# Patient Record
Sex: Female | Born: 1975
Health system: Southern US, Community
[De-identification: ages and names within clinical notes are randomized; demographics above are authoritative.]

## PROBLEM LIST (undated history)

## (undated) DIAGNOSIS — F988 Other specified behavioral and emotional disorders with onset usually occurring in childhood and adolescence: Secondary | ICD-10-CM

## (undated) DIAGNOSIS — I10 Essential (primary) hypertension: Secondary | ICD-10-CM

## (undated) DIAGNOSIS — Z6832 Body mass index (BMI) 32.0-32.9, adult: Secondary | ICD-10-CM

## (undated) DIAGNOSIS — Z973 Presence of spectacles and contact lenses: Secondary | ICD-10-CM

## (undated) DIAGNOSIS — N939 Abnormal uterine and vaginal bleeding, unspecified: Secondary | ICD-10-CM

## (undated) HISTORY — PX: TONSILLECTOMY AND ADENOIDECTOMY: SHX28

---

## 2002-10-12 HISTORY — PX: TUBAL LIGATION: SHX77

## 2007-10-13 HISTORY — PX: ENDOMETRIAL ABLATION: SHX621

## 2015-07-25 ENCOUNTER — Other Ambulatory Visit: Payer: Self-pay

## 2015-07-25 DIAGNOSIS — Z1231 Encounter for screening mammogram for malignant neoplasm of breast: Secondary | ICD-10-CM

## 2015-07-31 ENCOUNTER — Other Ambulatory Visit (HOSPITAL_COMMUNITY)
Admission: RE | Admit: 2015-07-31 | Discharge: 2015-07-31 | Disposition: A | Discharge status: Home / Self Care | Payer: 59 | Source: Ambulatory Visit | Attending: Family Medicine | Admitting: Family Medicine

## 2015-07-31 ENCOUNTER — Other Ambulatory Visit: Payer: Self-pay | Admitting: Family Medicine

## 2015-07-31 DIAGNOSIS — Z124 Encounter for screening for malignant neoplasm of cervix: Secondary | ICD-10-CM | POA: Insufficient documentation

## 2015-08-02 LAB — CYTOLOGY - PAP

## 2015-08-14 ENCOUNTER — Ambulatory Visit
Admission: RE | Admit: 2015-08-14 | Discharge: 2015-08-14 | Disposition: A | Discharge status: Home / Self Care | Payer: 59 | Source: Ambulatory Visit

## 2015-08-14 DIAGNOSIS — Z1231 Encounter for screening mammogram for malignant neoplasm of breast: Secondary | ICD-10-CM

## 2016-02-26 ENCOUNTER — Other Ambulatory Visit: Payer: Self-pay | Admitting: Family Medicine

## 2016-02-26 DIAGNOSIS — N63 Unspecified lump in unspecified breast: Secondary | ICD-10-CM

## 2016-03-02 ENCOUNTER — Ambulatory Visit
Admission: RE | Admit: 2016-03-02 | Discharge: 2016-03-02 | Disposition: A | Discharge status: Home / Self Care | Payer: 59 | Source: Ambulatory Visit | Attending: Family Medicine | Admitting: Family Medicine

## 2016-03-02 DIAGNOSIS — N63 Unspecified lump in unspecified breast: Secondary | ICD-10-CM

## 2017-02-19 DIAGNOSIS — I10 Essential (primary) hypertension: Secondary | ICD-10-CM | POA: Diagnosis not present

## 2017-04-21 DIAGNOSIS — I10 Essential (primary) hypertension: Secondary | ICD-10-CM | POA: Diagnosis not present

## 2017-08-06 DIAGNOSIS — I10 Essential (primary) hypertension: Secondary | ICD-10-CM | POA: Diagnosis not present

## 2017-08-06 DIAGNOSIS — Z0000 Encounter for general adult medical examination without abnormal findings: Secondary | ICD-10-CM | POA: Diagnosis not present

## 2017-08-06 DIAGNOSIS — Z23 Encounter for immunization: Secondary | ICD-10-CM | POA: Diagnosis not present

## 2018-02-07 DIAGNOSIS — E785 Hyperlipidemia, unspecified: Secondary | ICD-10-CM | POA: Diagnosis not present

## 2018-02-07 DIAGNOSIS — I10 Essential (primary) hypertension: Secondary | ICD-10-CM | POA: Diagnosis not present

## 2018-08-15 DIAGNOSIS — Z0000 Encounter for general adult medical examination without abnormal findings: Secondary | ICD-10-CM | POA: Diagnosis not present

## 2018-08-15 DIAGNOSIS — E785 Hyperlipidemia, unspecified: Secondary | ICD-10-CM | POA: Diagnosis not present

## 2018-09-13 ENCOUNTER — Other Ambulatory Visit (HOSPITAL_COMMUNITY)
Admission: RE | Admit: 2018-09-13 | Discharge: 2018-09-13 | Disposition: A | Discharge status: Home / Self Care | Payer: 59 | Source: Ambulatory Visit | Attending: Internal Medicine | Admitting: Internal Medicine

## 2018-09-13 ENCOUNTER — Other Ambulatory Visit: Payer: Self-pay | Admitting: Internal Medicine

## 2018-09-13 DIAGNOSIS — Z124 Encounter for screening for malignant neoplasm of cervix: Secondary | ICD-10-CM | POA: Diagnosis not present

## 2018-09-13 DIAGNOSIS — I10 Essential (primary) hypertension: Secondary | ICD-10-CM | POA: Diagnosis not present

## 2018-09-13 DIAGNOSIS — Z01419 Encounter for gynecological examination (general) (routine) without abnormal findings: Secondary | ICD-10-CM | POA: Diagnosis not present

## 2018-09-19 LAB — CYTOLOGY - PAP
Chlamydia: NEGATIVE
Diagnosis: NEGATIVE
HPV 16/18/45 genotyping: NEGATIVE
HPV: DETECTED — AB

## 2018-10-04 DIAGNOSIS — I10 Essential (primary) hypertension: Secondary | ICD-10-CM | POA: Diagnosis not present

## 2018-11-28 DIAGNOSIS — I10 Essential (primary) hypertension: Secondary | ICD-10-CM | POA: Diagnosis not present

## 2019-08-28 ENCOUNTER — Other Ambulatory Visit: Payer: Self-pay | Admitting: Internal Medicine

## 2019-08-28 DIAGNOSIS — Z1231 Encounter for screening mammogram for malignant neoplasm of breast: Secondary | ICD-10-CM

## 2019-10-20 ENCOUNTER — Other Ambulatory Visit: Payer: Self-pay

## 2019-10-20 ENCOUNTER — Ambulatory Visit
Admission: RE | Admit: 2019-10-20 | Discharge: 2019-10-20 | Disposition: A | Discharge status: Home / Self Care | Payer: 59 | Source: Ambulatory Visit | Attending: Internal Medicine | Admitting: Internal Medicine

## 2019-10-20 DIAGNOSIS — Z1231 Encounter for screening mammogram for malignant neoplasm of breast: Secondary | ICD-10-CM

## 2020-10-18 ENCOUNTER — Other Ambulatory Visit: Payer: Self-pay | Admitting: Internal Medicine

## 2020-10-18 DIAGNOSIS — Z1231 Encounter for screening mammogram for malignant neoplasm of breast: Secondary | ICD-10-CM

## 2020-10-21 ENCOUNTER — Ambulatory Visit: Payer: 59

## 2020-11-29 ENCOUNTER — Ambulatory Visit
Admission: RE | Admit: 2020-11-29 | Discharge: 2020-11-29 | Disposition: A | Discharge status: Home / Self Care | Payer: 59 | Source: Ambulatory Visit | Attending: Internal Medicine | Admitting: Internal Medicine

## 2020-11-29 ENCOUNTER — Other Ambulatory Visit: Payer: Self-pay

## 2020-11-29 DIAGNOSIS — Z1231 Encounter for screening mammogram for malignant neoplasm of breast: Secondary | ICD-10-CM

## 2021-02-27 NOTE — H&P (Addendum)
Julie Chapman is an 45 y.o. [id number] s/p BTL (2004) and endometrial ablation (2008 or 2009) who is admitted for Total Vaginal Hysterectomy with Bilateral Salpingectomy for abnormal uterine bleeding.  Patient has a long-standing history of heavy bleeding since menarche at age 87. She reports left-sided abdominal pain with menses as well. She states that the endometrial ablation did not work to aid with her bleeding. She declines management with Hysteroscopy with Dilation and Currettage and IUD placement. She desires definitive management via hysterectomy for her heavy bleeding as it impacts her daily life.  Work-up: NILM (+) HRHPV (negative 16/18/45) 2019 (to be performed again prior to surgery) EMB (12/25/20): benign endometrial polyp, secretory endometrium, day 16-17, post-ovulatory day 2-3. No atypia or malignancy. CBC (09/2020): 6.4>14/42<270 TSH (2019): 1.78 (wnl)  TVUS (21-Jul-1976): Uterus 9.71 x 5.66 x 7.28cm, endometrial thickness 0.70cm, fibroid 1 1.43cm, fibroid 2 2.30cm. R ovary 3.63cm, L ovary 2.74cm. Retroverted uterus - inhomogenous in appearance. Uterine fibroids - posterior 1.4 x 1.3 x 1.0cm, left pedunculated 2.6 x 2.3 x 1.9cm. Endometrium thickened and trilayered with questionable hyperchoic mass fundal posterior wall 0.9 x 0.7 x 0.4cm, no blood flow noted; s/p ablation. R ovary - simple cyst 2.6 x 2.2 x 1.8cm, avascular. Left ovary wnl. No adnexal masses seen.  Patient Active Problem List   Diagnosis Date Noted  . Dysmenorrhea 03/04/2021  . Attention deficit disorder 03/04/2021  . Excessive and frequent menstruation 03/04/2021  . Hyperlipidemia 03/04/2021  . Hypertension 03/04/2021  . Irregular periods 03/04/2021   MEDICAL/FAMILY/SOCIAL HX: No LMP recorded.    Past Medical History:  Diagnosis Date  . Abnormal uterine bleeding (AUB)   . ADD (attention deficit disorder)   . BMI 32.0-32.9,adult   . Hypertension     Past Surgical History:  Procedure Laterality Date  .  ENDOMETRIAL ABLATION    . TONSILLECTOMY AND ADENOIDECTOMY    . TUBAL LIGATION      No family history on file.  Social History:  has no history on file for tobacco use, alcohol use, and drug use.  ALLERGIES/MEDS:  Allergies: Not on File  No medications prior to admission.     Review of Systems  Constitutional: Negative.   HENT: Negative.   Eyes: Negative.   Respiratory: Negative.   Cardiovascular: Negative.   Gastrointestinal: Negative.   Genitourinary: Negative.   Musculoskeletal: Negative.   Skin: Negative.   Neurological: Negative.   Endo/Heme/Allergies: Negative.   Psychiatric/Behavioral: Negative.     There were no vitals taken for this visit. Gen: NAD, pleasant, and cooperative Cardio: RRR Lungs:  CTAB, no wheezes/rales/rhonchi Abd:  Soft, non-distended, non-tender Ext:  No bilateral LE edema Pelvic: Labia - unremarkable, vagina - pink moist mucosa, no lesions or abnormal discharge, cervix - no discharge or lesions or CMT, adnexa - no masses or tenderness, uterus - non-tender and normal size on palpation, good uterine descensus  No results found for this or any previous visit (from the past 24 hour(s)).  No results found.   ASSESSMENT/PLAN: Julie Chapman is a 45 y.o. [id number] s/p BTL (2004) and endometrial ablation (2008 or 2009) who is admitted for Total Vaginal Hysterectomy with Bilateral Salpingectomy for abnormal uterine bleeding.  - Admit to [facility] Main OR - Admit labs (CBC, T&S, BMP COVID screen) - Diet:  NPO - IVF:  Per anesthesia - VTE Prophylaxis:  SCDs - Antibiotics: Ancef 2g on call to OR - D/C home POD#1  Consents: I have explained to the patient that this surgery is performed  to remove the uterus through an incision in the vagina and that it will result in sterility.  I discussed the risks and benefits of the surgery, including, but not limited to bleeding, including the need for a blood transfusion, infection, damage to surrounding organs and  tissues, damage to bladder, damage to ureters, causing kidney damage, and requiring additional procedures, damage to bowels, resulting in further surgery, postoperative pain, short-term and long-term, scarring intra-abdominally, need for an open procedure, need for further surgery, deep vein thrombosis and/or pulmonary embolism, wound infection and/or separation, painful intercourse, urinary leakage, ovarian failure, resulting in menopausal symptoms requiring treatment, fistula formation, complications the course of which cannot be predicted or prevented, and death. Patient was consented for blood products.  The patient is aware that bleeding may result in the need for a blood transfusion which includes risk of transmission of HIV (1:2 million), Hepatitis C (1:2 million), and Hepatitis B (1:200 thousand) and transfusion reaction.  Patient voiced understanding of the above risks as well as understanding of indications for blood transfusion.   Steva Ready, DO [phone] (office)

## 2021-03-04 ENCOUNTER — Encounter (HOSPITAL_BASED_OUTPATIENT_CLINIC_OR_DEPARTMENT_OTHER): Payer: Self-pay | Admitting: Obstetrics and Gynecology

## 2021-03-04 DIAGNOSIS — N920 Excessive and frequent menstruation with regular cycle: Secondary | ICD-10-CM | POA: Insufficient documentation

## 2021-03-04 DIAGNOSIS — N946 Dysmenorrhea, unspecified: Secondary | ICD-10-CM | POA: Insufficient documentation

## 2021-03-04 DIAGNOSIS — F988 Other specified behavioral and emotional disorders with onset usually occurring in childhood and adolescence: Secondary | ICD-10-CM | POA: Insufficient documentation

## 2021-03-04 DIAGNOSIS — N926 Irregular menstruation, unspecified: Secondary | ICD-10-CM | POA: Insufficient documentation

## 2021-03-04 DIAGNOSIS — E785 Hyperlipidemia, unspecified: Secondary | ICD-10-CM | POA: Insufficient documentation

## 2021-03-04 DIAGNOSIS — I10 Essential (primary) hypertension: Secondary | ICD-10-CM | POA: Insufficient documentation

## 2021-03-05 ENCOUNTER — Other Ambulatory Visit: Payer: Self-pay

## 2021-03-05 ENCOUNTER — Encounter (HOSPITAL_BASED_OUTPATIENT_CLINIC_OR_DEPARTMENT_OTHER): Payer: Self-pay | Admitting: Obstetrics and Gynecology

## 2021-03-05 NOTE — Progress Notes (Signed)
YOU ARE SCHEDULED FOR A COVID TEST 03-11-2021 AT 1015. THIS TEST MUST BE DONE BEFORE SURGERY. GO TO  [address] [place], [place], IT IS APPROXIMATELY 2 MINUTES PAST [employer] ON THE RIGHT AND REMAIN IN YOUR CAR, THIS IS A DRIVE UP TEST.     Your procedure is scheduled on 03-13-2021  Report to [facility] [place] AT  1145 A. M.   Call this number if you have problems the morning of surgery  :[phone].   OUR ADDRESS IS [address].  WE ARE LOCATED IN [facility].  PLEASE BRING YOUR INSURANCE CARD AND PHOTO ID DAY OF SURGERY.  ONLY ONE PERSON ALLOWED IN FACILITY WAITING AREA.                                     REMEMBER:  DO NOT EAT FOOD, CANDY GUM OR MINTS  AFTER MIDNIGHT . YOU MAY HAVE CLEAR LIQUIDS FROM MIDNIGHT UNTIL 1045. NO CLEAR LIQUIDS AFTER  1045 DAY OF SURGERY.   YOU MAY  BRUSH YOUR TEETH MORNING OF SURGERY AND RINSE YOUR MOUTH OUT, NO CHEWING GUM CANDY OR MINTS.    CLEAR LIQUID DIET   Foods Allowed                                                                     Foods Excluded  Coffee and tea, regular and decaf                             liquids that you cannot  Plain Jell-O any favor except red or purple                                           see through such as: Fruit ices (not with fruit pulp)                                     milk, soups, orange juice  Iced Popsicles                                    All solid food Carbonated beverages, regular and diet                                    Cranberry, grape and apple juices Sports drinks like Gatorade Lightly seasoned clear broth or consume(fat free) Sugar, honey syrup  Sample Menu Breakfast                                Lunch  Supper Cranberry juice                    Beef broth                            Chicken broth Jell-O                                     Grape juice                           Apple juice Coffee  or tea                        Jell-O                                      Popsicle                                                Coffee or tea                        Coffee or tea  _____________________________________________________________________     TAKE THESE MEDICATIONS MORNING OF SURGERY WITH A SIP OF WATER:  NONE (TAKE YOUR LISINOPRIL DAY BEFORE SURGERY BUT NOT DAY OF SURGERY.  ONE VISITOR IS ALLOWED IN WAITING ROOM ONLY DAY OF SURGERY.  NO VISITOR MAY SPEND THE NIGHT.  VISITOR ARE ALLOWED TO STAY UNTIL 800 PM.                                    DO NOT WEAR JEWERLY, MAKE UP. DO NOT WEAR LOTIONS, POWDERS, PERFUMES OR DEODORANT. DO NOT SHAVE FOR 24 HOURS PRIOR TO DAY OF SURGERY. MEN MAY SHAVE FACE AND NECK. CONTACTS, GLASSES, OR DENTURES MAY NOT BE WORN TO SURGERY.                                    [facility] IS NOT RESPONSIBLE  FOR ANY BELONGINGS.                                                                    Marland Kitchen           [facility] - Preparing for Surgery Before surgery, you can play an important role.  Because skin is not sterile, your skin needs to be as free of germs as possible.  You can reduce the number of germs on your skin by washing with CHG (chlorahexidine gluconate) soap before surgery.  CHG is an antiseptic cleaner which kills germs and bonds with the skin to continue killing germs even after washing. Please DO NOT use if you have an allergy to  CHG or antibacterial soaps.  If your skin becomes reddened/irritated stop using the CHG and inform your nurse when you arrive at Short Stay. Do not shave (including legs and underarms) for at least 48 hours prior to the first CHG shower.  You may shave your face/neck. Please follow these instructions carefully:  1.  Shower with CHG Soap the night before surgery and the  morning of Surgery.  2.  If you choose to wash your hair, wash your hair first as usual with your  normal  shampoo.  3.  After you shampoo, rinse your hair  and body thoroughly to remove the  shampoo.                            4.  Use CHG as you would any other liquid soap.  You can apply chg directly  to the skin and wash                      Gently with a scrungie or clean washcloth.  5.  Apply the CHG Soap to your body ONLY FROM THE NECK DOWN.   Do not use on face/ open                           Wound or open sores. Avoid contact with eyes, ears mouth and genitals (private parts).                       Wash face,  Genitals (private parts) with your normal soap.             6.  Wash thoroughly, paying special attention to the area where your surgery  will be performed.  7.  Thoroughly rinse your body with warm water from the neck down.  8.  DO NOT shower/wash with your normal soap after using and rinsing off  the CHG Soap.                9.  Pat yourself dry with a clean towel.            10.  Wear clean pajamas.            11.  Place clean sheets on your bed the night of your first shower and do not  sleep with pets. Day of Surgery : Do not apply any lotions/deodorants the morning of surgery.  Please wear clean clothes to the hospital/surgery center.  FAILURE TO FOLLOW THESE INSTRUCTIONS MAY RESULT IN THE CANCELLATION OF YOUR SURGERY PATIENT SIGNATURE_________________________________  NURSE SIGNATURE__________________________________  ________________________________________________________________________                                                        QUESTIONS Annye Asa PRE OP NURSE PHONE [phone]

## 2021-03-05 NOTE — Progress Notes (Addendum)
Spoke w/ via phone for pre-op interview---pt Lab needs dos--- URINE POCT-  Has lab appt 03-11-2021 830 am for cbc bmp t & s, ekg             Lab results-----none- COVID test -----03-11-2021 1015 Arrive at -------1145 am 03-13-2021 NPO after MN NO Solid Food.  Clear liquids from MN until---1045 am then npo Med rec completed Medications to take morning of surgery -----none Diabetic medication -----n/a Patient instructed to bring photo id and insurance card day of surgery Patient aware to have Driver (ride ) / caregiver spouse daniel will stay    for 24 hours after surgery  Patient Special Instructions -----pt given overnight stay instructions Pre-Op special Istructions -----none Patient verbalized understanding of instructions that were given at this phone interview. Patient denies shortness of breath, chest pain, fever, cough at this phone interview.

## 2021-03-11 ENCOUNTER — Other Ambulatory Visit: Payer: Self-pay

## 2021-03-11 ENCOUNTER — Encounter (HOSPITAL_COMMUNITY)
Admission: RE | Admit: 2021-03-11 | Discharge: 2021-03-11 | Disposition: A | Discharge status: Home / Self Care | Payer: 59 | Source: Ambulatory Visit | Attending: Obstetrics and Gynecology | Admitting: Obstetrics and Gynecology

## 2021-03-11 ENCOUNTER — Other Ambulatory Visit (HOSPITAL_COMMUNITY)
Admission: RE | Admit: 2021-03-11 | Discharge: 2021-03-11 | Disposition: A | Discharge status: Home / Self Care | Payer: 59 | Source: Ambulatory Visit | Attending: Obstetrics and Gynecology | Admitting: Obstetrics and Gynecology

## 2021-03-11 DIAGNOSIS — Z20822 Contact with and (suspected) exposure to covid-19: Secondary | ICD-10-CM | POA: Insufficient documentation

## 2021-03-11 DIAGNOSIS — Z01818 Encounter for other preprocedural examination: Secondary | ICD-10-CM | POA: Insufficient documentation

## 2021-03-11 LAB — BASIC METABOLIC PANEL
Anion gap: 8 (ref 5–15)
BUN: 7 mg/dL (ref 6–20)
CO2: 27 mmol/L (ref 22–32)
Calcium: 9.3 mg/dL (ref 8.9–10.3)
Chloride: 104 mmol/L (ref 98–111)
Creatinine, Ser: 0.82 mg/dL (ref 0.44–1.00)
GFR, Estimated: >60 mL/min (ref >60)
Glucose, Bld: 96 mg/dL (ref 70–99)
Potassium: 3.9 mmol/L (ref 3.5–5.1)
Sodium: 139 mmol/L (ref 135–145)

## 2021-03-11 LAB — CBC
HCT: 43.1 % (ref 36.0–46.0)
Hemoglobin: 14 g/dL (ref 12.0–15.0)
MCH: 28.9 pg (ref 26.0–34.0)
MCHC: 32.5 g/dL (ref 30.0–36.0)
MCV: 88.9 fL (ref 80.0–100.0)
Platelets: 246 10*3/uL (ref 150–400)
RBC: 4.85 MIL/uL (ref 3.87–5.11)
RDW: 12.3 % (ref 11.5–15.5)
WBC: 6.5 10*3/uL (ref 4.0–10.5)
nRBC: 0 % (ref 0.0–0.2)

## 2021-03-11 LAB — SARS CORONAVIRUS 2 (TAT 6-24 HRS): SARS Coronavirus 2: NEGATIVE

## 2021-03-13 ENCOUNTER — Encounter (HOSPITAL_BASED_OUTPATIENT_CLINIC_OR_DEPARTMENT_OTHER): Payer: Self-pay | Admitting: Obstetrics and Gynecology

## 2021-03-13 ENCOUNTER — Ambulatory Visit (HOSPITAL_BASED_OUTPATIENT_CLINIC_OR_DEPARTMENT_OTHER): Payer: 59 | Admitting: Anesthesiology

## 2021-03-13 ENCOUNTER — Ambulatory Visit (HOSPITAL_BASED_OUTPATIENT_CLINIC_OR_DEPARTMENT_OTHER)
Admission: RE | Admit: 2021-03-13 | Discharge: 2021-03-14 | Disposition: A | Discharge status: Home / Self Care | Payer: 59 | Attending: Obstetrics and Gynecology | Admitting: Obstetrics and Gynecology

## 2021-03-13 ENCOUNTER — Encounter (HOSPITAL_BASED_OUTPATIENT_CLINIC_OR_DEPARTMENT_OTHER)
Admission: RE | Disposition: A | Discharge status: Home / Self Care | Payer: Self-pay | Source: Home / Self Care | Attending: Obstetrics and Gynecology

## 2021-03-13 DIAGNOSIS — N920 Excessive and frequent menstruation with regular cycle: Secondary | ICD-10-CM | POA: Insufficient documentation

## 2021-03-13 DIAGNOSIS — N946 Dysmenorrhea, unspecified: Secondary | ICD-10-CM | POA: Insufficient documentation

## 2021-03-13 DIAGNOSIS — N939 Abnormal uterine and vaginal bleeding, unspecified: Secondary | ICD-10-CM | POA: Diagnosis present

## 2021-03-13 DIAGNOSIS — Z791 Long term (current) use of non-steroidal anti-inflammatories (NSAID): Secondary | ICD-10-CM | POA: Insufficient documentation

## 2021-03-13 HISTORY — DX: Other specified behavioral and emotional disorders with onset usually occurring in childhood and adolescence: F98.8

## 2021-03-13 HISTORY — DX: Presence of spectacles and contact lenses: Z97.3

## 2021-03-13 HISTORY — DX: Abnormal uterine and vaginal bleeding, unspecified: N93.9

## 2021-03-13 HISTORY — DX: Body mass index (BMI) 32.0-32.9, adult: Z68.32

## 2021-03-13 HISTORY — DX: Essential (primary) hypertension: I10

## 2021-03-13 HISTORY — PX: VAGINAL HYSTERECTOMY: SHX2639

## 2021-03-13 LAB — TYPE AND SCREEN
ABO/RH(D): A POS
Antibody Screen: NEGATIVE

## 2021-03-13 LAB — POCT PREGNANCY, URINE: Preg Test, Ur: NEGATIVE

## 2021-03-13 LAB — ABO/RH: ABO/RH(D): A POS

## 2021-03-13 SURGERY — HYSTERECTOMY, VAGINAL
Anesthesia: General | Site: Vagina

## 2021-03-13 MED ORDER — ROCURONIUM BROMIDE 10 MG/ML (PF) SYRINGE
PREFILLED_SYRINGE | INTRAVENOUS | Status: DC | PRN
  Administered 2021-03-13: 70 mg via INTRAVENOUS

## 2021-03-13 MED ORDER — FENTANYL CITRATE (PF) 250 MCG/5ML IJ SOLN
INTRAMUSCULAR | Status: AC
  Filled 2021-03-13: qty 5

## 2021-03-13 MED ORDER — CEFAZOLIN SODIUM-DEXTROSE 2-4 GM/100ML-% IV SOLN
2.0000 g | INTRAVENOUS | Status: AC
  Administered 2021-03-13: 2 g via INTRAVENOUS

## 2021-03-13 MED ORDER — PROPOFOL 10 MG/ML IV BOLUS
INTRAVENOUS | Status: AC
  Filled 2021-03-13: qty 20

## 2021-03-13 MED ORDER — SIMETHICONE 80 MG PO CHEW
CHEWABLE_TABLET | ORAL | Status: AC
  Filled 2021-03-13: qty 1

## 2021-03-13 MED ORDER — LIDOCAINE 2% (20 MG/ML) 5 ML SYRINGE
INTRAMUSCULAR | Status: DC | PRN
  Administered 2021-03-13: 80 mg via INTRAVENOUS

## 2021-03-13 MED ORDER — OXYCODONE HCL 5 MG PO TABS
5.0000 mg | ORAL_TABLET | ORAL | Status: DC | PRN
  Administered 2021-03-13: 5 mg via ORAL
  Administered 2021-03-14 (×3): 10 mg via ORAL

## 2021-03-13 MED ORDER — ONDANSETRON HCL 4 MG/2ML IJ SOLN
INTRAMUSCULAR | Status: DC | PRN
  Administered 2021-03-13: 4 mg via INTRAVENOUS

## 2021-03-13 MED ORDER — ONDANSETRON HCL 4 MG/2ML IJ SOLN
INTRAMUSCULAR | Status: AC
  Filled 2021-03-13: qty 2

## 2021-03-13 MED ORDER — HYDROMORPHONE HCL 1 MG/ML IJ SOLN
INTRAMUSCULAR | Status: AC
  Filled 2021-03-13: qty 1

## 2021-03-13 MED ORDER — DEXAMETHASONE SODIUM PHOSPHATE 10 MG/ML IJ SOLN
INTRAMUSCULAR | Status: DC | PRN
  Administered 2021-03-13: 8 mg via INTRAVENOUS

## 2021-03-13 MED ORDER — MIDAZOLAM HCL 2 MG/2ML IJ SOLN
INTRAMUSCULAR | Status: AC
  Filled 2021-03-13: qty 2

## 2021-03-13 MED ORDER — IBUPROFEN 800 MG PO TABS
ORAL_TABLET | ORAL | Status: AC
  Filled 2021-03-13: qty 1

## 2021-03-13 MED ORDER — SODIUM CHLORIDE 0.9 % IV SOLN
6.2500 mg | Freq: Four times a day (QID) | INTRAVENOUS | Status: DC | PRN
  Filled 2021-03-13: qty 0.25

## 2021-03-13 MED ORDER — SCOPOLAMINE 1 MG/3DAYS TD PT72
1.0000 | MEDICATED_PATCH | TRANSDERMAL | Status: DC
  Administered 2021-03-13: 1.5 mg via TRANSDERMAL

## 2021-03-13 MED ORDER — LIDOCAINE-EPINEPHRINE 1 %-1:100000 IJ SOLN
INTRAMUSCULAR | Status: DC | PRN
  Administered 2021-03-13: 20 mL

## 2021-03-13 MED ORDER — ACETAMINOPHEN 500 MG PO TABS
1000.0000 mg | ORAL_TABLET | Freq: Once | ORAL | Status: AC
  Administered 2021-03-13: 1000 mg via ORAL

## 2021-03-13 MED ORDER — ONDANSETRON HCL 4 MG/2ML IJ SOLN
4.0000 mg | Freq: Four times a day (QID) | INTRAMUSCULAR | Status: DC | PRN
  Administered 2021-03-13: 4 mg via INTRAVENOUS

## 2021-03-13 MED ORDER — ROCURONIUM BROMIDE 10 MG/ML (PF) SYRINGE
PREFILLED_SYRINGE | INTRAVENOUS | Status: AC
  Filled 2021-03-13: qty 10

## 2021-03-13 MED ORDER — FENTANYL CITRATE (PF) 100 MCG/2ML IJ SOLN: 25.0000 ug | INTRAMUSCULAR | Status: DC | PRN

## 2021-03-13 MED ORDER — CEFAZOLIN SODIUM-DEXTROSE 2-4 GM/100ML-% IV SOLN
INTRAVENOUS | Status: AC
  Filled 2021-03-13: qty 100

## 2021-03-13 MED ORDER — LIDOCAINE 2% (20 MG/ML) 5 ML SYRINGE
INTRAMUSCULAR | Status: AC
  Filled 2021-03-13: qty 5

## 2021-03-13 MED ORDER — IBUPROFEN 800 MG PO TABS
800.0000 mg | ORAL_TABLET | Freq: Three times a day (TID) | ORAL | Status: DC
  Administered 2021-03-13 – 2021-03-14 (×2): 800 mg via ORAL

## 2021-03-13 MED ORDER — DOCUSATE SODIUM 100 MG PO CAPS
100.0000 mg | ORAL_CAPSULE | Freq: Two times a day (BID) | ORAL | Status: DC
  Administered 2021-03-13: 100 mg via ORAL

## 2021-03-13 MED ORDER — SIMETHICONE 80 MG PO CHEW
80.0000 mg | CHEWABLE_TABLET | Freq: Four times a day (QID) | ORAL | Status: DC | PRN
  Administered 2021-03-13: 80 mg via ORAL

## 2021-03-13 MED ORDER — PROMETHAZINE HCL 25 MG/ML IJ SOLN
INTRAMUSCULAR | Status: AC
  Filled 2021-03-13: qty 1

## 2021-03-13 MED ORDER — HYDROMORPHONE HCL 1 MG/ML IJ SOLN
0.2500 mg | INTRAMUSCULAR | Status: DC | PRN
  Administered 2021-03-13 (×2): 0.5 mg via INTRAVENOUS

## 2021-03-13 MED ORDER — PROMETHAZINE HCL 25 MG/ML IJ SOLN: 6.2500 mg | INTRAMUSCULAR | Status: DC | PRN

## 2021-03-13 MED ORDER — OXYCODONE HCL 5 MG PO TABS
ORAL_TABLET | ORAL | Status: AC
  Filled 2021-03-13: qty 2

## 2021-03-13 MED ORDER — ACETAMINOPHEN 500 MG PO TABS
1000.0000 mg | ORAL_TABLET | Freq: Four times a day (QID) | ORAL | Status: DC | PRN
  Administered 2021-03-13 – 2021-03-14 (×3): 1000 mg via ORAL

## 2021-03-13 MED ORDER — MORPHINE SULFATE (PF) 2 MG/ML IV SOLN
INTRAVENOUS | Status: AC
  Filled 2021-03-13: qty 1

## 2021-03-13 MED ORDER — ACETAMINOPHEN 500 MG PO TABS
ORAL_TABLET | ORAL | Status: AC
  Filled 2021-03-13: qty 2

## 2021-03-13 MED ORDER — PROMETHAZINE HCL 25 MG/ML IJ SOLN
6.2500 mg | Freq: Four times a day (QID) | INTRAMUSCULAR | Status: DC | PRN
  Administered 2021-03-13: 6.25 mg via INTRAVENOUS
  Filled 2021-03-13: qty 1

## 2021-03-13 MED ORDER — FENTANYL CITRATE (PF) 100 MCG/2ML IJ SOLN
INTRAMUSCULAR | Status: DC | PRN
  Administered 2021-03-13: 100 ug via INTRAVENOUS
  Administered 2021-03-13: 25 ug via INTRAVENOUS
  Administered 2021-03-13: 50 ug via INTRAVENOUS

## 2021-03-13 MED ORDER — PROPOFOL 10 MG/ML IV BOLUS
INTRAVENOUS | Status: DC | PRN
  Administered 2021-03-13: 160 mg via INTRAVENOUS

## 2021-03-13 MED ORDER — LACTATED RINGERS IV SOLN: INTRAVENOUS | Status: DC

## 2021-03-13 MED ORDER — DOCUSATE SODIUM 100 MG PO CAPS
ORAL_CAPSULE | ORAL | Status: AC
  Filled 2021-03-13: qty 1

## 2021-03-13 MED ORDER — SUGAMMADEX SODIUM 200 MG/2ML IV SOLN
INTRAVENOUS | Status: DC | PRN
  Administered 2021-03-13: 180 mg via INTRAVENOUS

## 2021-03-13 MED ORDER — ONDANSETRON HCL 4 MG PO TABS
4.0000 mg | ORAL_TABLET | Freq: Four times a day (QID) | ORAL | Status: DC | PRN
Start: 2021-03-13 — End: 2021-03-14

## 2021-03-13 MED ORDER — MORPHINE SULFATE (PF) 4 MG/ML IV SOLN
1.0000 mg | INTRAVENOUS | Status: DC | PRN
  Administered 2021-03-13 (×2): 1 mg via INTRAVENOUS

## 2021-03-13 MED ORDER — DEXAMETHASONE SODIUM PHOSPHATE 10 MG/ML IJ SOLN
INTRAMUSCULAR | Status: AC
  Filled 2021-03-13: qty 1

## 2021-03-13 MED ORDER — KETOROLAC TROMETHAMINE 30 MG/ML IJ SOLN
INTRAMUSCULAR | Status: DC | PRN
  Administered 2021-03-13: 30 mg via INTRAVENOUS

## 2021-03-13 MED ORDER — SCOPOLAMINE 1 MG/3DAYS TD PT72
MEDICATED_PATCH | TRANSDERMAL | Status: AC
  Filled 2021-03-13: qty 1

## 2021-03-13 MED ORDER — MIDAZOLAM HCL 5 MG/5ML IJ SOLN
INTRAMUSCULAR | Status: DC | PRN
  Administered 2021-03-13: 2 mg via INTRAVENOUS

## 2021-03-13 SURGICAL SUPPLY — 26 items
COVER MAYO STAND STRL (DRAPES) ×4 IMPLANT
COVER WAND RF STERILE (DRAPES) ×2 IMPLANT
DRAPE STERI URO 9X17 APER PCH (DRAPES) ×2 IMPLANT
GLOVE BIO SURGEON STRL SZ 6.5 (GLOVE) ×2 IMPLANT
GLOVE SURG ENC MOIS LTX SZ6.5 (GLOVE) ×2 IMPLANT
GLOVE SURG ENC MOIS LTX SZ7 (GLOVE) ×6 IMPLANT
GLOVE SURG POLYISO LF SZ7 (GLOVE) ×4 IMPLANT
GLOVE SURG UNDER POLY LF SZ6.5 (GLOVE) ×4 IMPLANT
GLOVE SURG UNDER POLY LF SZ7 (GLOVE) ×6 IMPLANT
GOWN STRL REUS W/TWL LRG LVL3 (GOWN DISPOSABLE) ×10 IMPLANT
GOWN STRL REUS W/TWL XL LVL3 (GOWN DISPOSABLE) ×4 IMPLANT
HEMOSTAT ARISTA ABSORB 3G PWDR (HEMOSTASIS) ×2 IMPLANT
KIT TURNOVER CYSTO (KITS) ×2 IMPLANT
LIGASURE IMPACT 36 18CM CVD LR (INSTRUMENTS) ×2 IMPLANT
Latex Biogel M 7.0 ×2 IMPLANT
MANIFOLD NEPTUNE II (INSTRUMENTS) ×2 IMPLANT
NS IRRIG 1000ML POUR BTL (IV SOLUTION) ×2 IMPLANT
PACK VAGINAL WOMENS (CUSTOM PROCEDURE TRAY) ×2 IMPLANT
PAD OB MATERNITY 4.3X12.25 (PERSONAL CARE ITEMS) ×2 IMPLANT
SUT VIC AB 0 CT1 27 (SUTURE) ×3
SUT VIC AB 0 CT1 27XCR 8 STRN (SUTURE) ×3 IMPLANT
SUT VIC AB 0 CT1 36 (SUTURE) ×2 IMPLANT
SYR BULB IRRIG 60ML STRL (SYRINGE) ×2 IMPLANT
TOWEL OR 17X26 10 PK STRL BLUE (TOWEL DISPOSABLE) ×2 IMPLANT
TRAY FOLEY W/BAG SLVR 14FR (SET/KITS/TRAYS/PACK) ×2 IMPLANT
UNDERPAD 30X36 HEAVY ABSORB (UNDERPADS AND DIAPERS) ×2 IMPLANT

## 2021-03-13 NOTE — Transfer of Care (Signed)
Immediate Anesthesia Transfer of Care Note  Patient: Julie Chapman  Procedure(s) Performed: TOTAL HYSTERECTOMY VAGINAL WITH BILATERAL SALPINGECTOMY. (N/A Vagina )  Patient Location: PACU  Anesthesia Type:General  Level of Consciousness: drowsy  Airway & Oxygen Therapy: Patient Spontanous Breathing and Patient connected to nasal cannula oxygen  Post-op Assessment: Report given to RN  Post vital signs: Reviewed and stable  Last Vitals:  Vitals Value Taken Time  BP 127/87 03/13/21 1555  Temp    Pulse 86 03/13/21 1556  Resp 15 03/13/21 1556  SpO2 100 % 03/13/21 1556  Vitals shown include unvalidated device data.  Last Pain:  Vitals:   03/13/21 1207  TempSrc: Oral  PainSc: 0-No pain      Patients Stated Pain Goal: 6 (03/13/21 1207)  Complications: No complications documented.

## 2021-03-13 NOTE — Interval H&P Note (Signed)
History and Physical Interval Note:  03/13/2021 1:40 PM  Julie Chapman  has presented today for surgery, with the diagnosis of Abnormal Uterine Bleeding (N93.9).  The various methods of treatment have been discussed with the patient and family. After consideration of risks, benefits and other options for treatment, the patient has consented to  Procedure(s): TOTAL HYSTERECTOMY VAGINAL WITH BILATERAL SALPINGECTOMY. (N/A) as a surgical intervention.  The patient's history has been reviewed, patient examined, no change in status, stable for surgery.  I have reviewed the patient's chart and labs.  Questions were answered to the patient's satisfaction.    Update: Pap smear performed in May 2022 - NILM/HRHPV neg.   Steva Ready

## 2021-03-13 NOTE — Op Note (Signed)
Pre Op Dx:   1. Abnormal uterine bleeding 2. History of endometrial ablation Post Op Dx:   Same as pre-op diagnoses Procedure:  Total Vaginal hysterectomy with bilateral salpingectomy and modified McCall's Culdoplasty  Surgeon:  Dr. Steva Ready Assistants:  Dr. Gerald Leitz Anesthesia:  General  EBL:  100cc  IVF:  See anesthesia documentation UOP:  100cc  Drains:  Foley catheter - removed at end of case Specimen removed:  Uterus, cervix, and bilateral fallopian tubes Device(s) implanted:  None Case Type:  Clean-contaminated Findings: Normal-appearing uterus, cervix, and bilateral fallopian tubes and ovaries. Complications: None Indications:  45 y.o. [id number] s/p BTL (2004) and endometrial ablation with AUB who desired definitive surgical management. Description of procedure: After informed consent was obtained the patient was brought to the operating room.  She was placed in dorsal supine position and anesthesia was administered.  She was placed in dorsal lithotomy position and prepped and draped in the usual sterile fashion.  A Foley catheter was placed.  A preoperative timeout was completed.  The cervix was grasped and the vaginal epithelium injected with 1% Lidocaine with epinephrine.  A circumferential colpotomy was created using electrosurgery.  The epithelium was divided further with scissors and blunt dissection.  The posterior cul-de-sac was entered sharply. The Steiner weighted retractor was placed. The anterior vesicouterine peritoneal reflection was identified and opened sharply.   The uterosacral ligaments were divided and suture ligated with 0 Vicryl.  The Deaver retractor was placed.  The cardinal ligaments with the uterine vasculature were divided and suture ligated.  Sequential pedicles of the broad ligament were isolated and divided using the Ligasure device. When the uterine fundus was reached, the fundus was delivered posteriorly.  The cornual pedicles containing the  uteroovarian anastomosis, fallopian tube, and round ligament were each divided using the Ligasure device. The uterus was removed and passed off the field.  The fallopian tubes were elevated and dissected free of the mesosalpinx using the Ligasure device.  Both tubes were removed.  The ovaries appeared normal.  Multiple areas of bleeding were noted on the bilateral triple pedicles which were made hemostatic using suture and electrosurgery. Hemostasis verified. The uterosacral ligaments were plicated to their ipsilateral vaginal cuff. A modified McCall's Culdoplasty was performed. The cuff was closed with sequential figure-of-eight sutures. Irrigation was performed and hemostasis verified.  She was returned to dorsal supine position, awakened and extubated having appeared to tolerate the procedure well.  All counts were correct.  She was transferred to PACU in good condition.   Disposition:  PACU  Steva Ready, DO

## 2021-03-13 NOTE — Anesthesia Preprocedure Evaluation (Addendum)
Anesthesia Evaluation  Patient identified by MRN, date of birth, ID band Patient awake    Reviewed: Allergy & Precautions, NPO status , Patient's Chart, lab work & pertinent test results  Airway Mallampati: II  TM Distance: >3 FB Neck ROM: Full    Dental no notable dental hx. (+) Teeth Intact, Dental Advisory Given   Pulmonary neg pulmonary ROS,    Pulmonary exam normal breath sounds clear to auscultation       Cardiovascular hypertension, Pt. on medications Normal cardiovascular exam Rhythm:Regular Rate:Normal     Neuro/Psych negative neurological ROS  negative psych ROS   GI/Hepatic negative GI ROS, Neg liver ROS,   Endo/Other  negative endocrine ROS  Renal/GU negative Renal ROS  negative genitourinary   Musculoskeletal negative musculoskeletal ROS (+)   Abdominal   Peds  (+) ATTENTION DEFICIT DISORDER WITHOUT HYPERACTIVITY Hematology negative hematology ROS (+)   Anesthesia Other Findings AUB  Reproductive/Obstetrics                            Anesthesia Physical Anesthesia Plan  ASA: II  Anesthesia Plan: General   Post-op Pain Management:    Induction: Intravenous  PONV Risk Score and Plan: 3 and Midazolam, Dexamethasone and Ondansetron  Airway Management Planned: Oral ETT  Additional Equipment:   Intra-op Plan:   Post-operative Plan: Extubation in OR  Informed Consent: I have reviewed the patients History and Physical, chart, labs and discussed the procedure including the risks, benefits and alternatives for the proposed anesthesia with the patient or authorized representative who has indicated his/her understanding and acceptance.     Dental advisory given  Plan Discussed with: CRNA  Anesthesia Plan Comments:         Anesthesia Quick Evaluation

## 2021-03-13 NOTE — Anesthesia Procedure Notes (Signed)
Procedure Name: Intubation Date/Time: 03/13/2021 2:04 PM Performed by: Bonney Aid, CRNA Pre-anesthesia Checklist: Patient identified, Emergency Drugs available, Suction available and Patient being monitored Patient Re-evaluated:Patient Re-evaluated prior to induction Oxygen Delivery Method: Circle system utilized Preoxygenation: Pre-oxygenation with 100% oxygen Induction Type: IV induction Ventilation: Mask ventilation without difficulty Laryngoscope Size: Mac and 3 Grade View: Grade I Tube type: Oral Tube size: 7.0 mm Number of attempts: 1 Airway Equipment and Method: Stylet Placement Confirmation: ETT inserted through vocal cords under direct vision,  positive ETCO2 and breath sounds checked- equal and bilateral Secured at: 19 cm Tube secured with: Tape Dental Injury: Teeth and Oropharynx as per pre-operative assessment

## 2021-03-14 ENCOUNTER — Encounter (HOSPITAL_BASED_OUTPATIENT_CLINIC_OR_DEPARTMENT_OTHER): Payer: Self-pay | Admitting: Obstetrics and Gynecology

## 2021-03-14 DIAGNOSIS — N939 Abnormal uterine and vaginal bleeding, unspecified: Secondary | ICD-10-CM | POA: Diagnosis not present

## 2021-03-14 LAB — SURGICAL PATHOLOGY

## 2021-03-14 MED ORDER — ACETAMINOPHEN 500 MG PO TABS
ORAL_TABLET | ORAL | Status: AC
  Filled 2021-03-14: qty 2

## 2021-03-14 MED ORDER — OXYCODONE HCL 5 MG PO TABS
ORAL_TABLET | ORAL | Status: AC
  Filled 2021-03-14: qty 2

## 2021-03-14 MED ORDER — IBUPROFEN 800 MG PO TABS
ORAL_TABLET | ORAL | Status: AC
  Filled 2021-03-14: qty 1

## 2021-03-14 MED ORDER — OXYCODONE HCL 5 MG PO TABS: 5.0000 mg | ORAL_TABLET | ORAL | 0 refills | Status: AC | PRN

## 2021-03-14 MED ORDER — IBUPROFEN 800 MG PO TABS: 800.0000 mg | ORAL_TABLET | Freq: Three times a day (TID) | ORAL | 0 refills | Status: AC

## 2021-03-14 NOTE — Anesthesia Postprocedure Evaluation (Signed)
Anesthesia Post Note  Patient: Julie Chapman  Procedure(s) Performed: TOTAL HYSTERECTOMY VAGINAL WITH BILATERAL SALPINGECTOMY. (N/A Vagina )     Patient location during evaluation: PACU Anesthesia Type: General Level of consciousness: awake and alert Pain management: pain level controlled Vital Signs Assessment: post-procedure vital signs reviewed and stable Respiratory status: spontaneous breathing, nonlabored ventilation, respiratory function stable and patient connected to nasal cannula oxygen Cardiovascular status: blood pressure returned to baseline and stable Postop Assessment: no apparent nausea or vomiting Anesthetic complications: no   No complications documented.  Last Vitals:  Vitals:   03/13/21 2231 03/14/21 0215  BP: 135/74 140/80  Pulse: 78 64  Resp: 20 16  Temp: 36.6 C 36.6 C  SpO2: 99% 99%    Last Pain:  Vitals:   03/14/21 0215  TempSrc:   PainSc: 3                  Jaan Fischel L Sebrina Kessner

## 2021-03-14 NOTE — Discharge Summary (Signed)
Physician Discharge Summary  Patient ID: Julie Chapman MRN: [id number] DOB/AGE: September 12, 1976 45 y.o.  Admit date: 03/13/2021 Discharge date: 03/14/2021  Admission Diagnoses: Abnormal uterine bleeding  Discharge Diagnoses:  Active Problems:   Abnormal uterine bleeding (AUB)  Procedure(s): Total Vaginal Hysterectomy and Bilateral Salpingectomy with McCall's Culdoplasty  Discharged Condition: good  Hospital Course: Patient was admitted on 03/13/2021 with the above named diagnoses for the above named procedure. Patient doing well post-operatively. Prior to hospital discharge, patient was ambulating, tolerating PO, pain was well-controlled, and voiding spontaneously. She was discharged in good condition.  Consults: None  Significant Diagnostic Studies: None  Treatments: surgery: Se above  Discharge Exam: Blood pressure 140/88, pulse 65, temperature 97.9 F (36.6 C), resp. rate 16, height 5\' 3"  (1.6 m), weight 77.9 kg, last menstrual period 02/21/2021, SpO2 98 %. Gen:  NAD, pleasant Cardio:  RRR Lungs:  CTAB, no wheezes/rales/rhonchi Abd: Soft, non-distended, non-tender Ext:  No bilateral LE edema, no bilateral calf tenderness  Disposition: Discharge disposition: 01-Home or Self Care       Discharge Instructions    Discharge patient   Complete by: As directed    Discharge disposition: 01-Home or Self Care   Discharge patient date: 03/14/2021     Allergies as of 03/14/2021   No Known Allergies     Medication List    TAKE these medications   amphetamine-dextroamphetamine 20 MG tablet Commonly known as: ADDERALL Take 20 mg by mouth 2 (two) times daily.   ibuprofen 800 MG tablet Commonly known as: ADVIL Take 1 tablet (800 mg total) by mouth every 8 (eight) hours.   lisinopril 10 MG tablet Commonly known as: ZESTRIL Take 10 mg by mouth daily.   oxyCODONE 5 MG immediate release tablet Commonly known as: Oxy IR/ROXICODONE Take 1-2 tablets (5-10 mg total) by mouth  every 4 (four) hours as needed for moderate pain.       Follow-up Information    05/14/2021, DO Follow up in 6 week(s).   Specialty: Obstetrics and Gynecology Why: Please keep yoru 6 week post-operative visit. Contact information: [address] [place] [address] [place] [phone]               Signed: [phone] 03/14/2021, 7:16 AM

## 2021-12-22 ENCOUNTER — Telehealth: Payer: Self-pay | Admitting: Hematology and Oncology

## 2021-12-22 NOTE — Telephone Encounter (Signed)
Scheduled appt per 3/13 referral. Pt is aware of appt date and time. Pt is aware to arrive 15 mins prior to appt time and to bring and updated insurance card. Pt is aware of appt location.   ?

## 2022-01-01 NOTE — Progress Notes (Signed)
[facility] ?CONSULT NOTE ? ?Patient Care Team: ?Lorenda Ishihara, MD as PCP - General (Internal Medicine) ? ?CHIEF COMPLAINTS/PURPOSE OF CONSULTATION:  ?Abnormal Laboratory Test (nucleated red blood cells) ? ?HISTORY OF PRESENTING ILLNESS:  ?Julie Chapman 46 y.o. female is here because of recent diagnosis of Abnormal Laboratory She presents to the clinic today for consult and labs.  She has been having multiple symptoms which include diffuse muscle aches and pains including skin sensitivity severe fatigue shortness of breath to exertion and on the recent blood work she was found to have nucleated red blood cells and this prompted an urgent evaluation from [place].  She has never had these blood findings previously.  She has not had any blood loss or recent episodes of infection or inflammation. ? ?I reviewed her records extensively and collaborated the history with the patient. ? ?MEDICAL HISTORY:  ?Past Medical History:  ?Diagnosis Date  ? Abnormal uterine bleeding (AUB)   ? ADD (attention deficit disorder)   ? Hypertension   ? Wears glasses   ? ? ?SURGICAL HISTORY: ?Past Surgical History:  ?Procedure Laterality Date  ? ENDOMETRIAL ABLATION  2009  ? TONSILLECTOMY AND ADENOIDECTOMY  age 44 1996  ? TUBAL LIGATION  2004  ? VAGINAL HYSTERECTOMY N/A 03/13/2021  ? Procedure: TOTAL HYSTERECTOMY VAGINAL WITH BILATERAL SALPINGECTOMY.;  Surgeon: Steva Ready, DO;  Location: [facility];  Service: Gynecology;  Laterality: N/A;  ? ? ?SOCIAL HISTORY: ?Social History  ? ?Socioeconomic History  ? Marital status: Married  ?  Spouse name: Not on file  ? Number of children: Not on file  ? Years of education: Not on file  ? Highest education level: Not on file  ?Occupational History  ? Not on file  ?Tobacco Use  ? Smoking status: Never  ? Smokeless tobacco: Never  ?Vaping Use  ? Vaping Use: Never used  ?Substance and Sexual Activity  ? Alcohol use: Not Currently  ? Drug use: Not Currently  ? Sexual  activity: Not on file  ?Other Topics Concern  ? Not on file  ?Social History Narrative  ? Not on file  ? ?Social Determinants of Health  ? ?Financial Resource Strain: Not on file  ?Food Insecurity: Not on file  ?Transportation Needs: Not on file  ?Physical Activity: Not on file  ?Stress: Not on file  ?Social Connections: Not on file  ?Intimate Partner Violence: Not on file  ? ? ?FAMILY HISTORY: ?No family history on file. ? ?ALLERGIES:  has No Known Allergies. ? ?MEDICATIONS:  ?Current Outpatient Medications  ?Medication Sig Dispense Refill  ? amphetamine-dextroamphetamine (ADDERALL) 20 MG tablet Take 20 mg by mouth 2 (two) times daily.    ? ibuprofen (ADVIL) 800 MG tablet Take 1 tablet (800 mg total) by mouth every 8 (eight) hours. 30 tablet 0  ? lisinopril (ZESTRIL) 10 MG tablet Take 10 mg by mouth daily.    ? oxyCODONE (OXY IR/ROXICODONE) 5 MG immediate release tablet Take 1-2 tablets (5-10 mg total) by mouth every 4 (four) hours as needed for moderate pain. 30 tablet 0  ? ?No current facility-administered medications for this visit.  ? ? ?REVIEW OF SYSTEMS:   ?Constitutional: Denies fevers, chills or abnormal night sweats ?Complains of fatigue, increased skin sensitivity in the left arm ? ?All other systems were reviewed with the patient and are negative. ? ?PHYSICAL EXAMINATION: ?ECOG PERFORMANCE STATUS: 1 - Symptomatic but completely ambulatory ? ?Vitals:  ? 01/02/22 1334  ?BP: (!) 142/97  ?Pulse: Marland Kitchen)  124  ?Resp: 18  ?Temp: 97.7 ?F (36.5 ?C)  ?SpO2: 98%  ? ?Filed Weights  ? 01/02/22 1334  ?Weight: 163 lb (73.9 kg)  ? ? ?  ? ?LABORATORY DATA:  ?I have reviewed the data as listed ?Lab Results  ?Component Value Date  ? WBC 6.5 03/11/2021  ? HGB 14.0 03/11/2021  ? HCT 43.1 03/11/2021  ? MCV 88.9 03/11/2021  ? PLT 246 03/11/2021  ? ?Lab Results  ?Component Value Date  ? NA 139 03/11/2021  ? K 3.9 03/11/2021  ? CL 104 03/11/2021  ? CO2 27 03/11/2021  ? ? ?RADIOGRAPHIC STUDIES: ?I have personally reviewed the  radiological reports and agreed with the findings in the report. ? ?ASSESSMENT AND PLAN:  ?Fatigue ?Constitutional symptoms of fatigue, increased skin sensitivity, joint aches and pains and muscle stiffness and achiness: Blood work revealed normal hemoglobin but few nucleated red blood cells.  This prompted the consultation. ?Nucleated red blood cells: Indicate a bone marrow stress that prompted the release of precursor red cells to the peripheral circulation.  It does not indicate any bone marrow pathology.  It is usually a response to some stressful event in the body.  The amount of nucleated red cells is extremely low and therefore we do not feel that this needs to be evaluated any further. ? ?Constitutional symptoms: I recommended that she discuss obtaining B12 levels, vitamin D levels and see if she has any deficiencies. ? ?Return to clinic on an as-needed basis. ? ? ?All questions were answered. The patient knows to call the clinic with any problems, questions or concerns. ?  ? Tamsen Meek, MD ?01/02/22 ? I Janan Ridge am scribing for Dr. Pamelia Hoit ? ?I have reviewed the above documentation for accuracy and completeness, and I agree with the above. ?  ?

## 2022-01-02 ENCOUNTER — Inpatient Hospital Stay: Payer: 59 | Attending: Hematology and Oncology | Admitting: Hematology and Oncology

## 2022-01-02 ENCOUNTER — Inpatient Hospital Stay: Payer: 59

## 2022-01-02 ENCOUNTER — Other Ambulatory Visit: Payer: Self-pay

## 2022-01-02 DIAGNOSIS — R718 Other abnormality of red blood cells: Secondary | ICD-10-CM | POA: Diagnosis present

## 2022-01-02 DIAGNOSIS — R5383 Other fatigue: Secondary | ICD-10-CM | POA: Diagnosis not present

## 2022-01-02 DIAGNOSIS — R5382 Chronic fatigue, unspecified: Secondary | ICD-10-CM

## 2022-01-02 NOTE — Assessment & Plan Note (Signed)
Constitutional symptoms of fatigue, increased skin sensitivity, joint aches and pains and muscle stiffness and achiness: Blood work revealed normal hemoglobin but few nucleated red blood cells.  This prompted the consultation. ?Nucleated red blood cells: Indicate a bone marrow stress that prompted the release of precursor red cells to the peripheral circulation.  It does not indicate any bone marrow pathology.  It is usually a response to some stressful event in the body.  The amount of nucleated red cells is extremely low and therefore we do not feel that this needs to be evaluated any further. ? ?Constitutional symptoms: I recommended that she discuss obtaining B12 levels, vitamin D levels and see if she has any deficiencies. ? ?Return to clinic on an as-needed basis. ?

## 2022-01-22 ENCOUNTER — Encounter: Payer: Self-pay | Admitting: Hematology and Oncology

## 2022-04-12 IMAGING — MG MM DIGITAL SCREENING BILAT W/ TOMO AND CAD
8 series · 8 of 24 positions shown · non-contrast
Comparison: Previous exam(s).

CLINICAL DATA: Screening.

EXAM:
DIGITAL SCREENING BILATERAL MAMMOGRAM WITH TOMOSYNTHESIS AND CAD
TECHNIQUE: Bilateral screening digital craniocaudal and mediolateral oblique
mammograms were obtained. Bilateral screening digital breast
tomosynthesis was performed. The images were evaluated with
computer-aided detection.

[R MLO synth-2D]
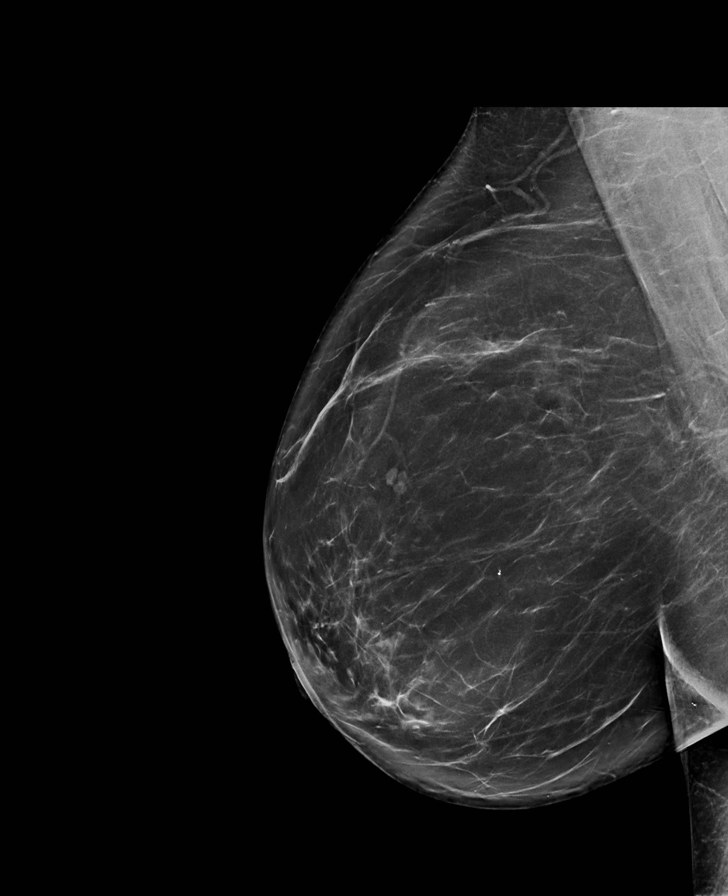

[L MLO synth-2D]
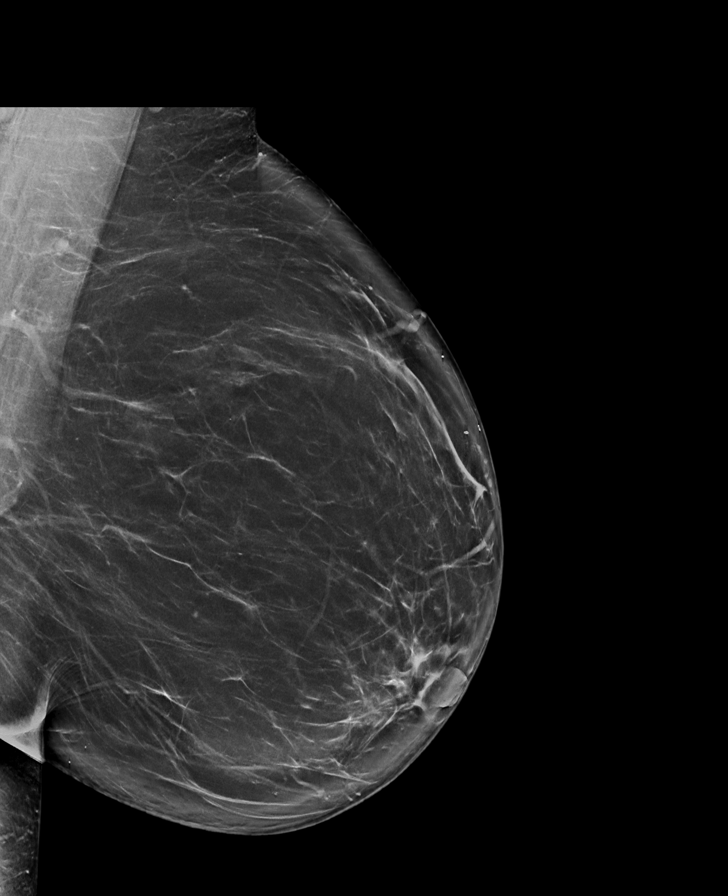

[L CC synth-2D]
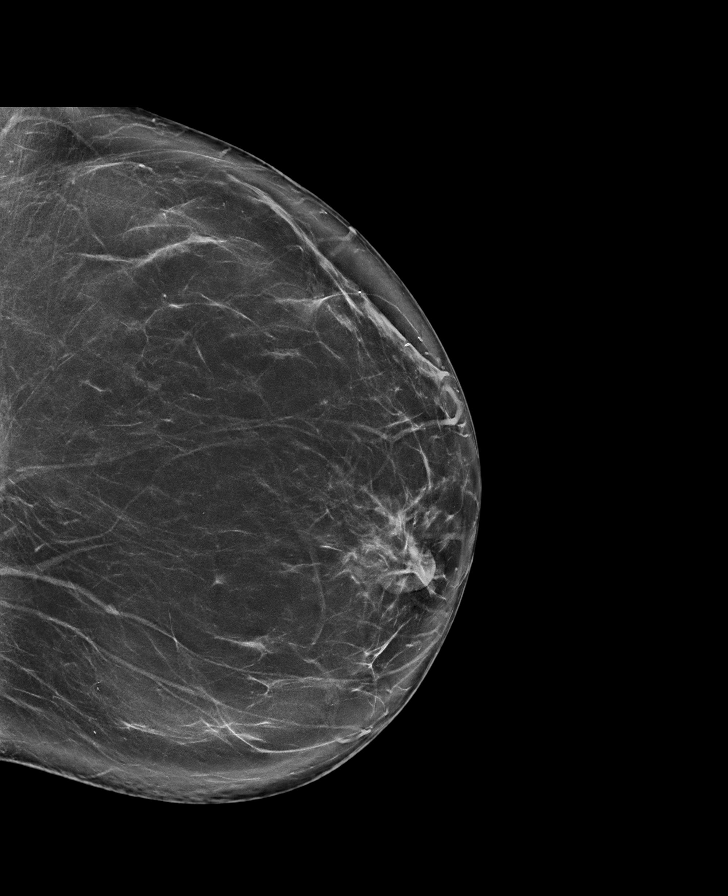

[R CC synth-2D]
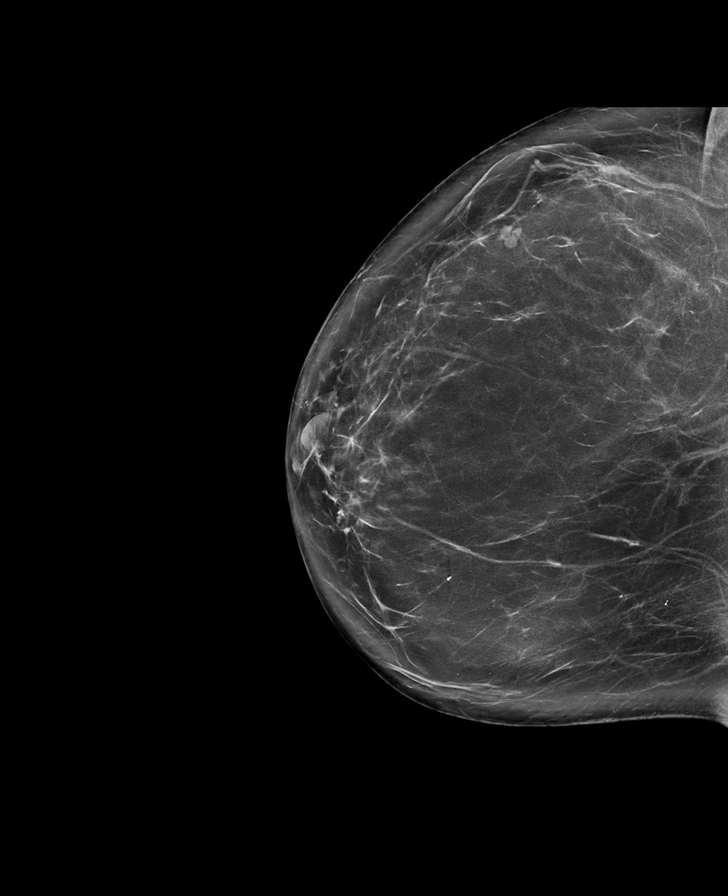

[L CC tomo · tomo slice 45/89.0]
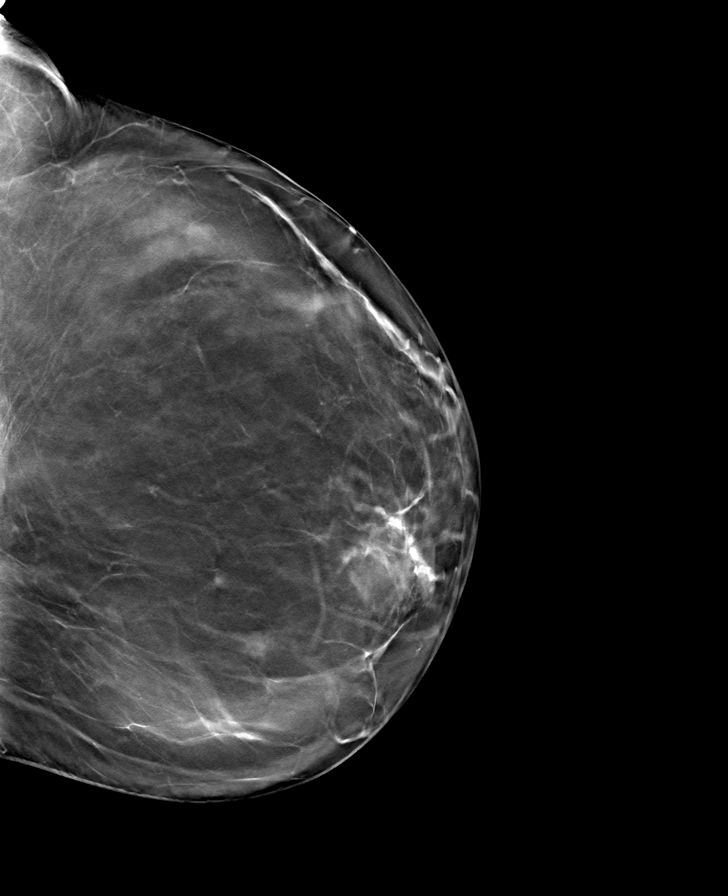

[R CC tomo · tomo slice 43/85.0]
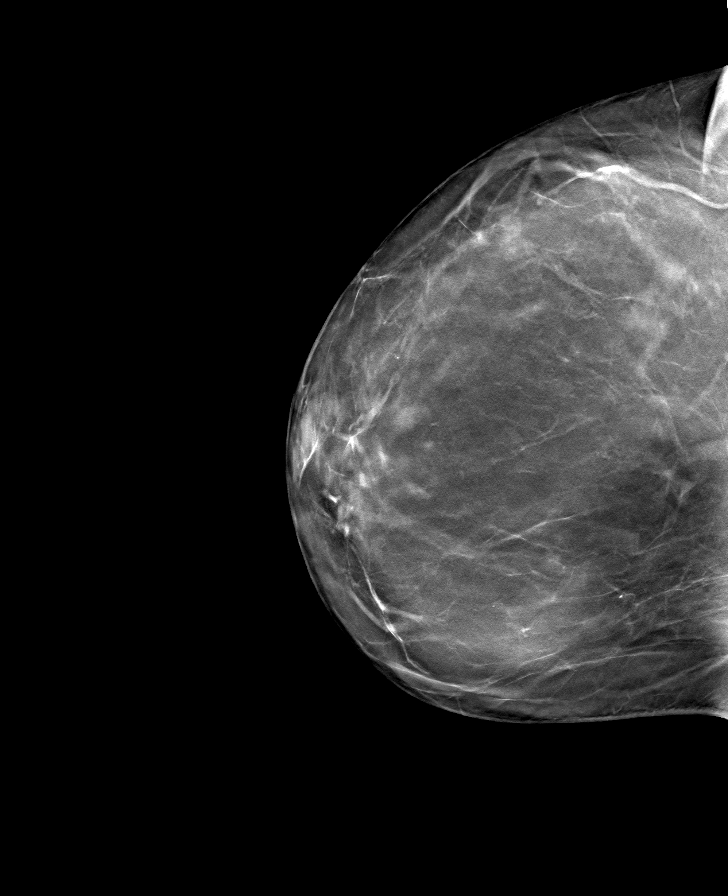

[R MLO tomo · tomo slice 45/90.0]
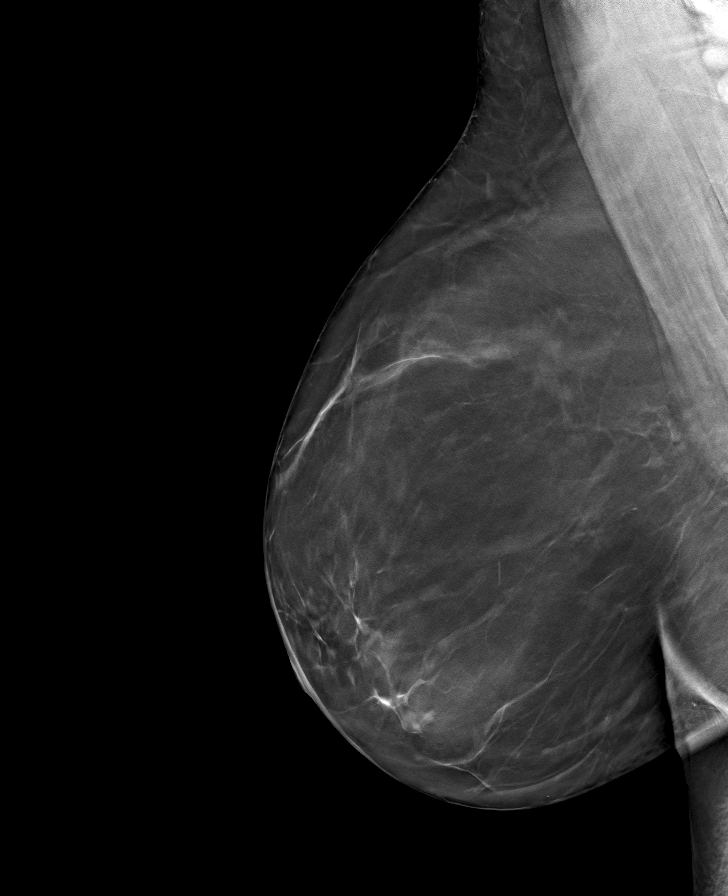

[L MLO tomo · tomo slice 46/91.0]
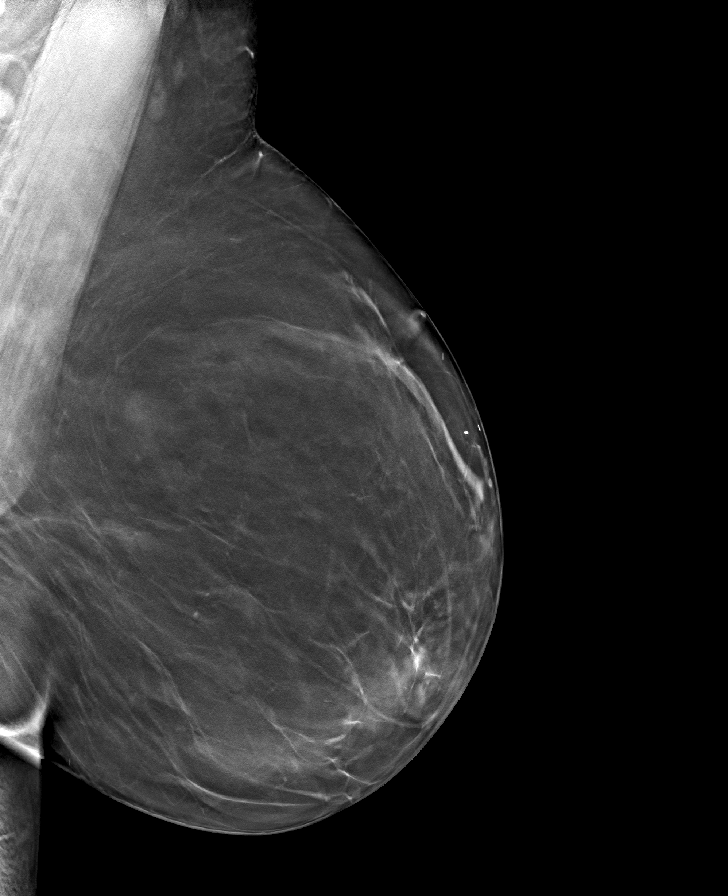

[8 of 24 positions shown; findings below may reference images not displayed]

ACR Breast Density Category b: There are scattered areas of
fibroglandular density.
FINDINGS: There are no findings suspicious for malignancy.
IMPRESSION: No mammographic evidence of malignancy. A result letter of this
screening mammogram will be mailed directly to the patient.

RECOMMENDATION:
Screening mammogram in one year. (Code:51-O-LD2)

BI-RADS CATEGORY  1: Negative.

## 2024-02-09 DIAGNOSIS — Z1231 Encounter for screening mammogram for malignant neoplasm of breast: Secondary | ICD-10-CM | POA: Diagnosis not present

## 2024-04-19 DIAGNOSIS — E785 Hyperlipidemia, unspecified: Secondary | ICD-10-CM | POA: Diagnosis not present

## 2024-04-19 DIAGNOSIS — I10 Essential (primary) hypertension: Secondary | ICD-10-CM | POA: Diagnosis not present

## 2024-04-19 DIAGNOSIS — K2930 Chronic superficial gastritis without bleeding: Secondary | ICD-10-CM | POA: Diagnosis not present

## 2024-04-19 DIAGNOSIS — F988 Other specified behavioral and emotional disorders with onset usually occurring in childhood and adolescence: Secondary | ICD-10-CM | POA: Diagnosis not present

## 2024-05-07 ENCOUNTER — Other Ambulatory Visit: Payer: Self-pay | Admitting: Medical Genetics

## 2024-05-10 DIAGNOSIS — T148XXA Other injury of unspecified body region, initial encounter: Secondary | ICD-10-CM | POA: Diagnosis not present

## 2024-05-10 DIAGNOSIS — M545 Low back pain, unspecified: Secondary | ICD-10-CM | POA: Diagnosis not present

## 2024-07-31 ENCOUNTER — Other Ambulatory Visit: Payer: Self-pay | Admitting: Medical Genetics

## 2024-07-31 DIAGNOSIS — Z006 Encounter for examination for normal comparison and control in clinical research program: Secondary | ICD-10-CM

## 2024-08-25 LAB — GENECONNECT MOLECULAR SCREEN: Genetic Analysis Overall Interpretation: NEGATIVE
